# Patient Record
Sex: Female | Born: 1945 | Race: White | Hispanic: No | Smoking: Never smoker
Health system: Southern US, Community
[De-identification: ages and names within clinical notes are randomized; demographics above are authoritative.]

## PROBLEM LIST (undated history)

## (undated) DIAGNOSIS — I10 Essential (primary) hypertension: Secondary | ICD-10-CM

## (undated) DIAGNOSIS — E785 Hyperlipidemia, unspecified: Secondary | ICD-10-CM

## (undated) HISTORY — PX: BREAST LUMPECTOMY: SHX2

## (undated) HISTORY — PX: REPLACEMENT TOTAL KNEE: SUR1224

## (undated) HISTORY — PX: CHOLECYSTECTOMY: SHX55

---

## 2013-04-23 ENCOUNTER — Emergency Department (HOSPITAL_BASED_OUTPATIENT_CLINIC_OR_DEPARTMENT_OTHER): Payer: PRIVATE HEALTH INSURANCE

## 2013-04-23 ENCOUNTER — Encounter (HOSPITAL_BASED_OUTPATIENT_CLINIC_OR_DEPARTMENT_OTHER): Payer: Self-pay

## 2013-04-23 ENCOUNTER — Emergency Department (HOSPITAL_BASED_OUTPATIENT_CLINIC_OR_DEPARTMENT_OTHER)
Admission: EM | Admit: 2013-04-23 | Discharge: 2013-04-23 | Disposition: A | Discharge status: Home / Self Care | Payer: PRIVATE HEALTH INSURANCE | Attending: Emergency Medicine | Admitting: Emergency Medicine

## 2013-04-23 DIAGNOSIS — E785 Hyperlipidemia, unspecified: Secondary | ICD-10-CM | POA: Insufficient documentation

## 2013-04-23 DIAGNOSIS — Y9301 Activity, walking, marching and hiking: Secondary | ICD-10-CM | POA: Insufficient documentation

## 2013-04-23 DIAGNOSIS — S6990XA Unspecified injury of unspecified wrist, hand and finger(s), initial encounter: Secondary | ICD-10-CM | POA: Insufficient documentation

## 2013-04-23 DIAGNOSIS — S32592A Other specified fracture of left pubis, initial encounter for closed fracture: Secondary | ICD-10-CM

## 2013-04-23 DIAGNOSIS — Z880 Allergy status to penicillin: Secondary | ICD-10-CM | POA: Insufficient documentation

## 2013-04-23 DIAGNOSIS — Z79899 Other long term (current) drug therapy: Secondary | ICD-10-CM | POA: Insufficient documentation

## 2013-04-23 DIAGNOSIS — Y9289 Other specified places as the place of occurrence of the external cause: Secondary | ICD-10-CM | POA: Insufficient documentation

## 2013-04-23 DIAGNOSIS — S79919A Unspecified injury of unspecified hip, initial encounter: Secondary | ICD-10-CM | POA: Insufficient documentation

## 2013-04-23 DIAGNOSIS — W010XXA Fall on same level from slipping, tripping and stumbling without subsequent striking against object, initial encounter: Secondary | ICD-10-CM | POA: Insufficient documentation

## 2013-04-23 DIAGNOSIS — S32509A Unspecified fracture of unspecified pubis, initial encounter for closed fracture: Secondary | ICD-10-CM | POA: Insufficient documentation

## 2013-04-23 DIAGNOSIS — Z7982 Long term (current) use of aspirin: Secondary | ICD-10-CM | POA: Insufficient documentation

## 2013-04-23 DIAGNOSIS — I10 Essential (primary) hypertension: Secondary | ICD-10-CM | POA: Insufficient documentation

## 2013-04-23 DIAGNOSIS — IMO0002 Reserved for concepts with insufficient information to code with codable children: Secondary | ICD-10-CM | POA: Insufficient documentation

## 2013-04-23 HISTORY — DX: Essential (primary) hypertension: I10

## 2013-04-23 HISTORY — DX: Hyperlipidemia, unspecified: E78.5

## 2013-04-23 MED ORDER — OXYCODONE-ACETAMINOPHEN 5-325 MG PO TABS
1.0000 | ORAL_TABLET | Freq: Once | ORAL | Status: AC
  Administered 2013-04-23: 1 via ORAL
  Filled 2013-04-23 (×2): qty 1

## 2013-04-23 MED ORDER — OXYCODONE-ACETAMINOPHEN 5-325 MG PO TABS: 1.0000 | ORAL_TABLET | ORAL | Status: AC | PRN

## 2013-04-23 NOTE — ED Provider Notes (Signed)
History  This chart was scribed for Rolan Bucco, MD, by Candelaria Stagers, ED Scribe. This patient was seen in room MH12/MH12 and the patient's care was started at 4:30 PM   CSN: [id number]  Arrival date & time 04/23/13  1612   First MD Initiated Contact with Patient 04/23/13 1624      Chief Complaint  Patient presents with  . Fall     The history is provided by the patient. No language interpreter was used.   Tamara Deleon is a 67 y.o. female who presents to the Emergency Department complaining of left hip pain after she fell while walking landing on her left side last night around 9PM. She states her legs got tangled up which caused her to fall She reports catching herself with her hands and is also experiencing bilateral hand pain, left elbow pain, and an abrasion to the left elbow.  Pt states that bearing weight makes the hip pain worse.  She denies hitting her head or LOC.  She denies neck pain.  Pt has taken ibuprofen with no relief.   Past Medical History  Diagnosis Date  . Hypertension   . Hyperlipidemia     Past Surgical History  Procedure Laterality Date  . Breast lumpectomy      History reviewed. No pertinent family history.  History  Substance Use Topics  . Smoking status: Never Smoker   . Smokeless tobacco: Never Used  . Alcohol Use: No    OB History   Grav Para Term Preterm Abortions TAB SAB Ect Mult Living                  Review of Systems  Constitutional: Negative for fever, chills, diaphoresis and fatigue.  HENT: Negative for congestion, rhinorrhea and sneezing.   Eyes: Negative.   Respiratory: Negative for cough, chest tightness and shortness of breath.   Cardiovascular: Negative for chest pain and leg swelling.  Gastrointestinal: Negative for nausea, vomiting, abdominal pain, diarrhea and blood in stool.  Genitourinary: Negative for frequency, hematuria, flank pain and difficulty urinating.  Musculoskeletal: Positive for arthralgias and gait  problem. Negative for back pain.  Skin: Negative for rash and wound.  Neurological: Negative for dizziness, speech difficulty, weakness, numbness and headaches.  All other systems reviewed and are negative.    Allergies  Penicillins  Home Medications   Current Outpatient Rx  Name  Route  Sig  Dispense  Refill  . aspirin 81 MG tablet   Oral   Take 81 mg by mouth daily.         . hydrochlorothiazide (MICROZIDE) 12.5 MG capsule   Oral   Take 12.5 mg by mouth daily.         Marland Kitchen lisinopril (PRINIVIL,ZESTRIL) 10 MG tablet   Oral   Take 10 mg by mouth daily.         . multivitamin-iron-minerals-folic acid (CENTRUM) chewable tablet   Oral   Chew 1 tablet by mouth daily.         . simvastatin (ZOCOR) 40 MG tablet   Oral   Take 40 mg by mouth at bedtime.         Marland Kitchen oxyCODONE-acetaminophen (PERCOCET) 5-325 MG per tablet   Oral   Take 1 tablet by mouth every 4 (four) hours as needed for pain.   20 tablet   0     BP 115/65  Pulse 79  Temp(Src) 98.1 F (36.7 C) (Oral)  Resp 20  Ht 5\' 7"  (1.702 m)  Wt 156 lb (70.761 kg)  BMI 24.43 kg/m2  SpO2 96%  Physical Exam  Constitutional: She is oriented to person, place, and time. She appears well-developed and well-nourished.  HENT:  Head: Normocephalic and atraumatic.  Eyes: Pupils are equal, round, and reactive to light.  Neck: Normal range of motion. Neck supple.  Cardiovascular: Normal rate, regular rhythm and normal heart sounds.   Pulmonary/Chest: Effort normal and breath sounds normal. No respiratory distress. She has no wheezes. She has no rales. She exhibits no tenderness.  Abdominal: Soft. Bowel sounds are normal. There is no tenderness. There is no rebound and no guarding.  Musculoskeletal: Normal range of motion. She exhibits no edema.  Patient has no tenderness along the spine. She has some mild pain on range of motion of the left hip. She has pain on palpation of the pubic symphysis. She has no pain on range  of motion of the right hip. She has small amount of ecchymosis over his left elbow and the left knee but no underlying bony tenderness. No other pain on palpation or range of motion of extremities  Lymphadenopathy:    She has no cervical adenopathy.  Neurological: She is alert and oriented to person, place, and time.  Skin: Skin is warm and dry. No rash noted.  Psychiatric: She has a normal mood and affect.    ED Course  Procedures   DIAGNOSTIC STUDIES: Oxygen Saturation is 96% on room air, normal by my interpretation.    COORDINATION OF CARE:  4:33 PM Discussed course of care with pt which includes images of left hip and pelvis.  Pt understands and agrees.   5:16 PM Discussed images with pt.   Labs Reviewed - No data to display Dg Hip Complete Left  04/23/2013  *RADIOLOGY REPORT*  Clinical Data: Fall, left hip pain  LEFT HIP - COMPLETE 2+ VIEW  Comparison: None.  Findings: Three views of the left hip submitted.  No acute fracture or subluxation.  Mild bilateral superior acetabular spurring.  IMPRESSION: No acute fracture or subluxation.   Original Report Authenticated By: Natasha Mead, M.D.    Ct Hip Left Wo Contrast  04/23/2013  *RADIOLOGY REPORT*  Clinical Data: Left hip pain with limited weightbearing since falling last night.  CT OF THE LEFT HIP WITHOUT CONTRAST  Technique:  Multidetector CT imaging was performed according to the standard protocol. Multiplanar CT image reconstructions were also generated.  Comparison: Radiographs same date.  Findings: Examination is limited to the left hip and lower left hemi pelvis.  The entire pelvis is not imaged.  There is a mildly displaced fracture of the left superior pubic ramus with intra-articular extension into the anterior acetabulum. The posterior acetabulum is intact.  There is no evidence of dislocation or proximal femoral fracture.  There is no evidence of femoral head avascular necrosis.  The left inferior pubic ramus is intact.  Reformatted  images partially include the right hip.  There are mild degenerative changes of both hips, slightly worse on the right. The sacroiliac joints are not imaged.  Moderate degenerative changes are present at the symphysis pubis.  There is no significant associated hematoma.  A small hip joint effusion is noted.  Diffuse diverticular changes of the sigmoid colon are present.  IMPRESSION:  1.  Mildly displaced fracture of the left superior pubic ramus with intra-articular extension into the left anterior acetabulum. 2.  No evidence of dislocation or proximal femur fracture. 3.  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   Original Report Authenticated By: Carey Bullocks, M.D.      1. Pubic ramus fracture, left, closed, initial encounter       MDM  I discussed the patient's x-ray with the orthopedist on call, Dr. Sherlean Foot, who advised to have pt f/u in his office next week to reevaluate the x-rays to ensure that the fracture is not extending further into the acetabulum. I discussed this with the patient and the patient's daughter. I gave her prescription for a walker to use at home as well as a prescription for pain medication.  I personally performed the services described in this documentation, which was scribed in my presence.  The recorded information has been reviewed and considered.         Rolan Bucco, MD 04/23/13 2350

## 2013-04-23 NOTE — ED Provider Notes (Signed)
History     CSN: [id number]  Arrival date & time 04/23/13  1612   First MD Initiated Contact with Patient 04/23/13 1624      Chief Complaint  Patient presents with  . Fall    (Consider location/radiation/quality/duration/timing/severity/associated sxs/prior treatment) HPI  Past Medical History  Diagnosis Date  . Hypertension   . Hyperlipidemia     Past Surgical History  Procedure Laterality Date  . Breast lumpectomy      History reviewed. No pertinent family history.  History  Substance Use Topics  . Smoking status: Never Smoker   . Smokeless tobacco: Never Used  . Alcohol Use: No    OB History   Grav Para Term Preterm Abortions TAB SAB Ect Mult Living                  Review of Systems  Allergies  Penicillins  Home Medications   Current Outpatient Rx  Name  Route  Sig  Dispense  Refill  . aspirin 81 MG tablet   Oral   Take 81 mg by mouth daily.         . hydrochlorothiazide (MICROZIDE) 12.5 MG capsule   Oral   Take 12.5 mg by mouth daily.         Marland Kitchen lisinopril (PRINIVIL,ZESTRIL) 10 MG tablet   Oral   Take 10 mg by mouth daily.         . multivitamin-iron-minerals-folic acid (CENTRUM) chewable tablet   Oral   Chew 1 tablet by mouth daily.         . simvastatin (ZOCOR) 40 MG tablet   Oral   Take 40 mg by mouth at bedtime.         Marland Kitchen oxyCODONE-acetaminophen (PERCOCET) 5-325 MG per tablet   Oral   Take 1 tablet by mouth every 4 (four) hours as needed for pain.   20 tablet   0     BP 119/52  Pulse 71  Temp(Src) 98.1 F (36.7 C) (Oral)  Resp 20  Ht 5\' 7"  (1.702 m)  Wt 156 lb (70.761 kg)  BMI 24.43 kg/m2  SpO2 96%  Physical Exam  ED Course  Procedures (including critical care time)  Labs Reviewed - No data to display Dg Hip Complete Left  04/23/2013  *RADIOLOGY REPORT*  Clinical Data: Fall, left hip pain  LEFT HIP - COMPLETE 2+ VIEW  Comparison: None.  Findings: Three views of the left hip submitted.  No acute fracture  or subluxation.  Mild bilateral superior acetabular spurring.  IMPRESSION: No acute fracture or subluxation.   Original Report Authenticated By: Natasha Mead, M.D.    Ct Hip Left Wo Contrast  04/23/2013  *RADIOLOGY REPORT*  Clinical Data: Left hip pain with limited weightbearing since falling last night.  CT OF THE LEFT HIP WITHOUT CONTRAST  Technique:  Multidetector CT imaging was performed according to the standard protocol. Multiplanar CT image reconstructions were also generated.  Comparison: Radiographs same date.  Findings: Examination is limited to the left hip and lower left hemi pelvis.  The entire pelvis is not imaged.  There is a mildly displaced fracture of the left superior pubic ramus with intra-articular extension into the anterior acetabulum. The posterior acetabulum is intact.  There is no evidence of dislocation or proximal femoral fracture.  There is no evidence of femoral head avascular necrosis.  The left inferior pubic ramus is intact.  Reformatted images partially include the right hip.  There are mild degenerative changes of both  hips, slightly worse on the right. The sacroiliac joints are not imaged.  Moderate degenerative changes are present at the symphysis pubis.  There is no significant associated hematoma.  A small hip joint effusion is noted.  Diffuse diverticular changes of the sigmoid colon are present.  IMPRESSION:  1.  Mildly displaced fracture of the left superior pubic ramus with intra-articular extension into the left anterior acetabulum. 2.  No evidence of dislocation or proximal femur fracture. 3.  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   Original Report Authenticated By: Carey Bullocks, M.D.      1. Pubic ramus fracture, left, closed, initial encounter       MDM  I discussed the x-ray findings with Dr. Valentina Gu with orthopedics who recommends that the patient followup in his office next week for early x-ray to make sure that the fracture is not  extending further into the acetabulum. I discussed this with the patient and her daughter. I will prescribe her a walker to use at home. I also prescribed her Percocet for pain. Advised to return as needed for any worsening symptoms.       Rolan Bucco, MD 04/23/13 1840

## 2013-04-23 NOTE — ED Notes (Signed)
Pt states that she fell just prior to arrival, states that she has severe pain on entire L side of her body, denies numbness tingling, PMS intact.  C/o severe L hip pain.

## 2013-09-05 IMAGING — CT CT HIP*L* W/O CM
2 of 3 series · 17 of 46 positions shown, 19 images · non-contrast
Comparison: Radiographs same date.

CLINICAL DATA: Left hip pain with limited weightbearing since
falling last night.

CT OF THE LEFT HIP WITHOUT CONTRAST
TECHNIQUE: Multidetector CT imaging was performed according to the
standard protocol. Multiplanar CT image reconstructions were also
generated.

[Series 3: hip 2.0 b31f st · axial · 0.34mm/px · z∈[-260,-118]mm · 14 of 83 slices shown, 16 images]
[im 6/83  soft-tissue]
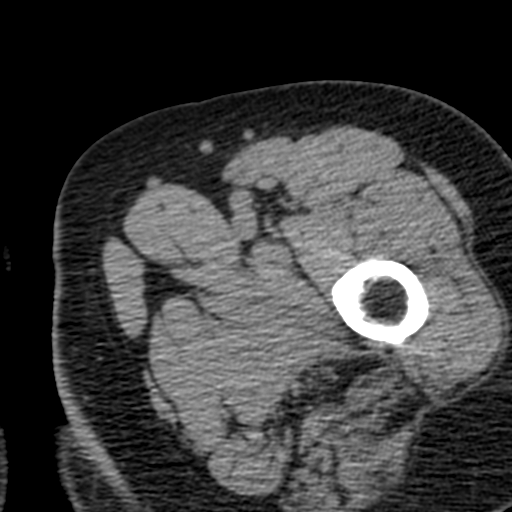
[im 6/83  bone]
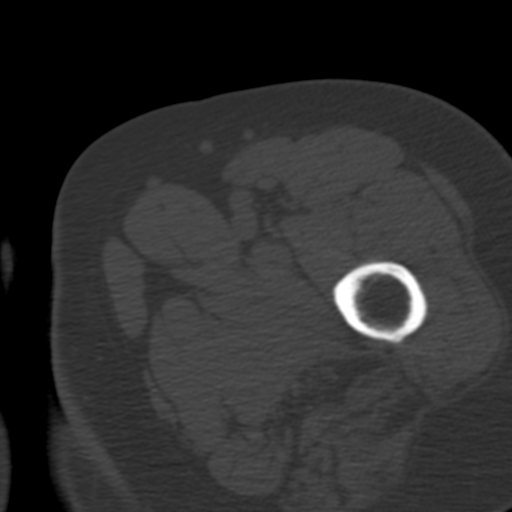
[im 11/83  soft-tissue]
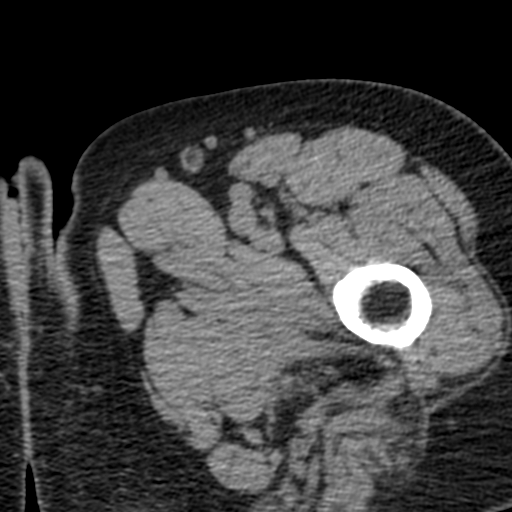
[im 16/83  soft-tissue]
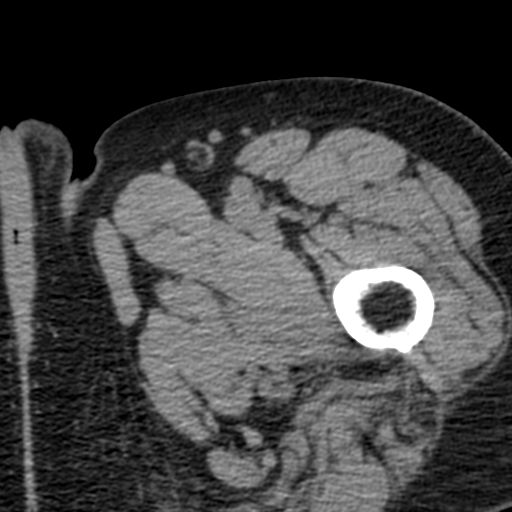
[im 22/83  soft-tissue]
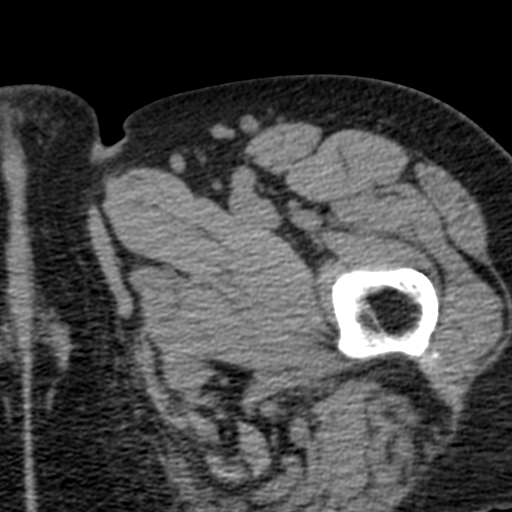
[im 27/83  soft-tissue]
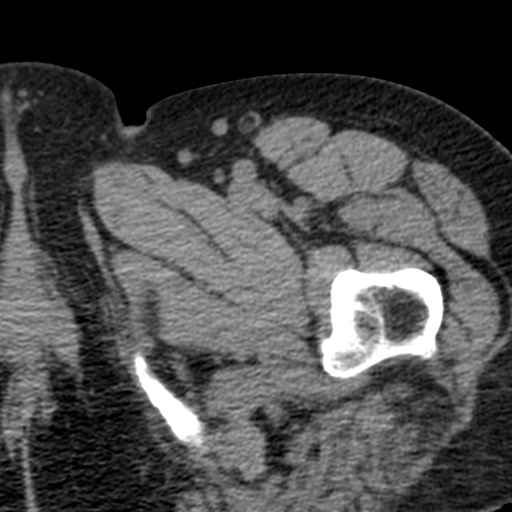
[im 32/83  soft-tissue]
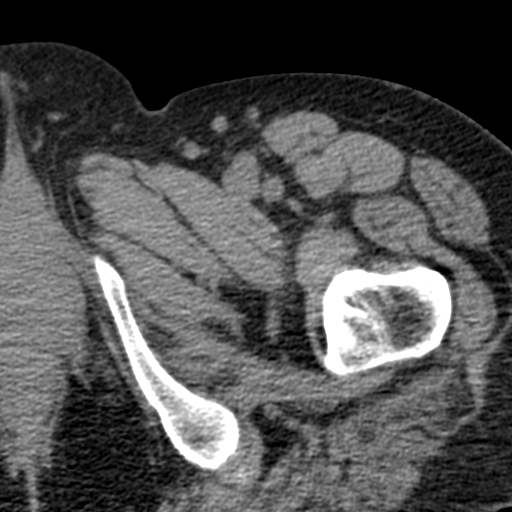
[im 38/83  soft-tissue]
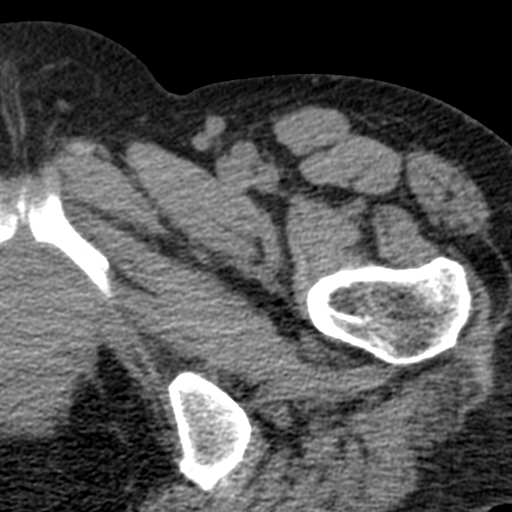
[im 45/83  soft-tissue]
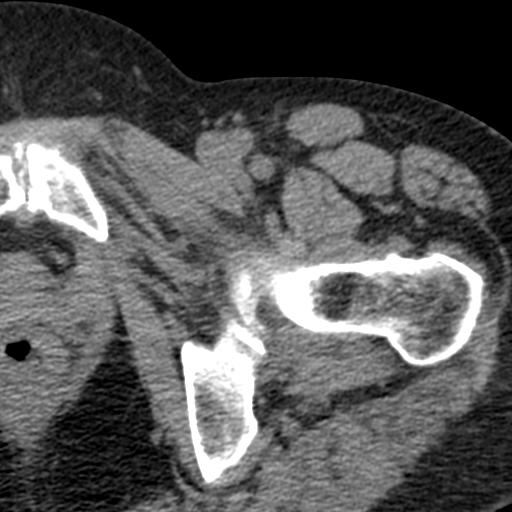
[im 51/83  soft-tissue]
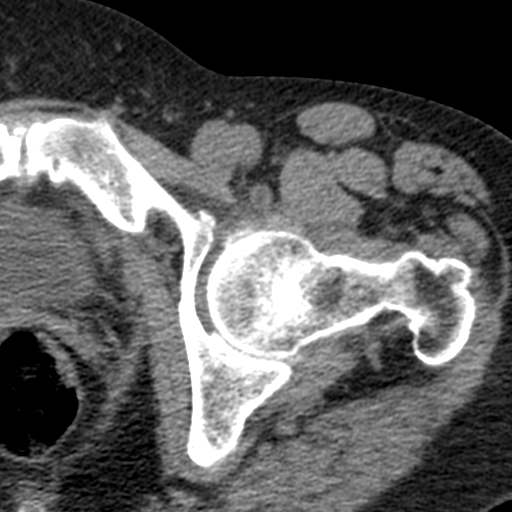
[im 51/83  bone]
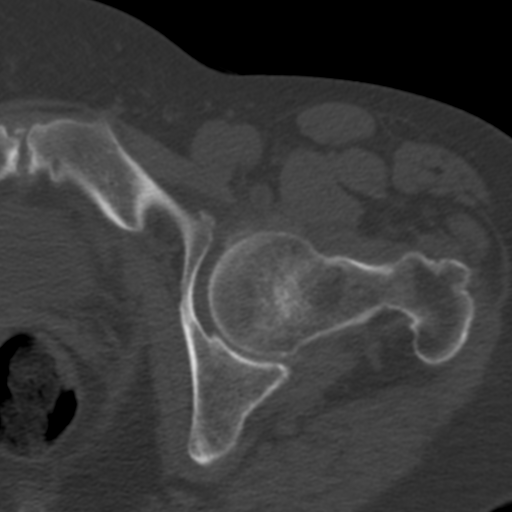
[im 56/83  soft-tissue]
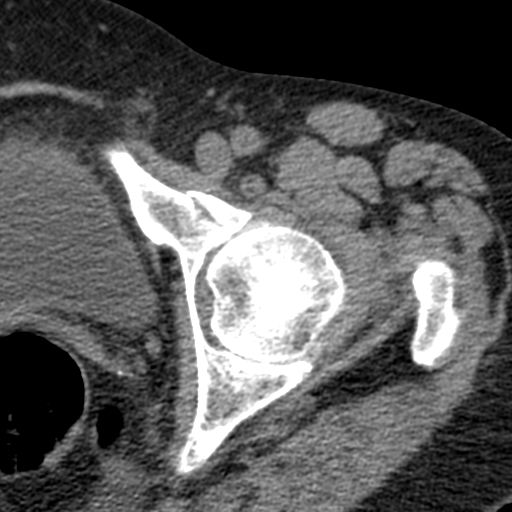
[im 61/83  soft-tissue]
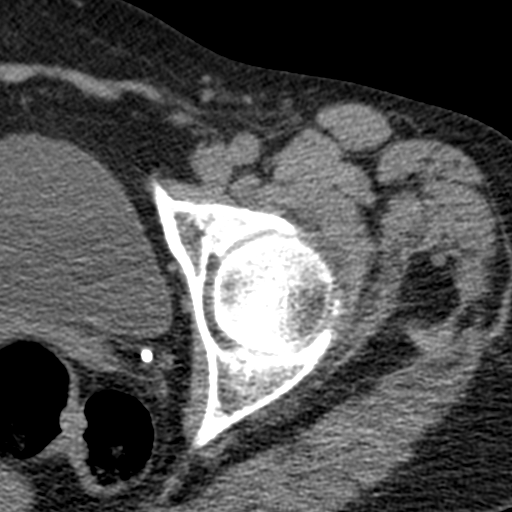
[im 67/83  soft-tissue]
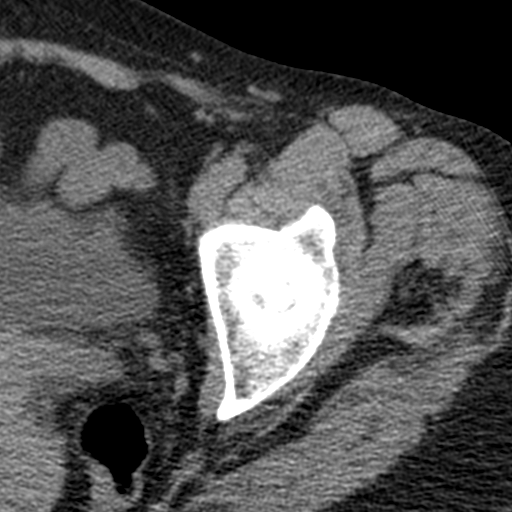
[im 72/83  soft-tissue]
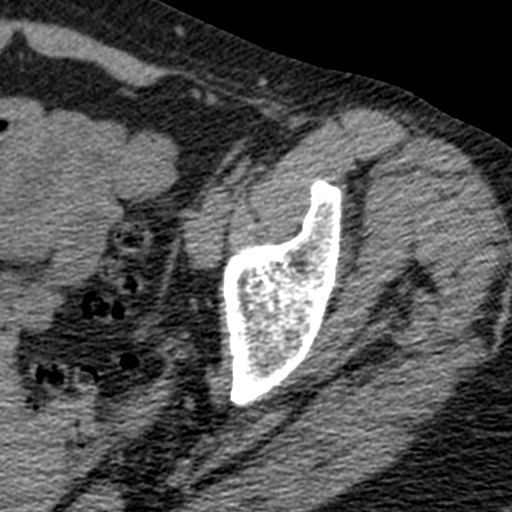
[im 77/83  soft-tissue]
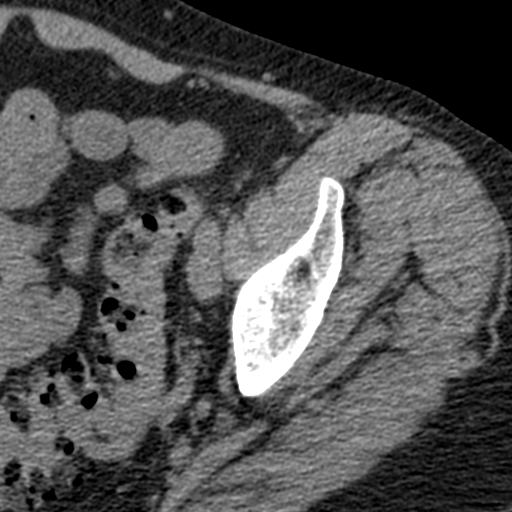

[Series 4: hip 2.0 coronal · coronal · 0.74mm/px · 3 of 53 slices shown]
[im 18/53  soft-tissue]
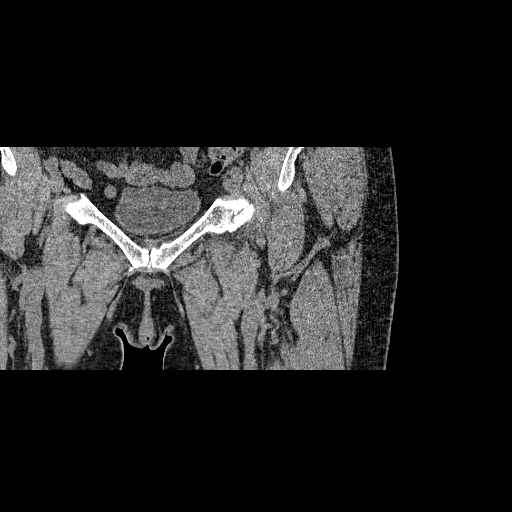
[im 24/53  soft-tissue]
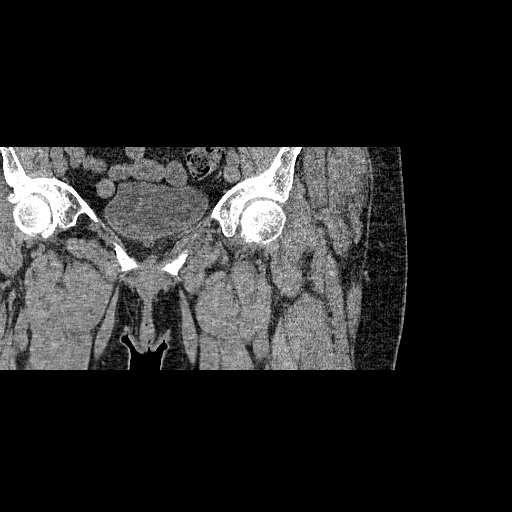
[im 29/53  soft-tissue]
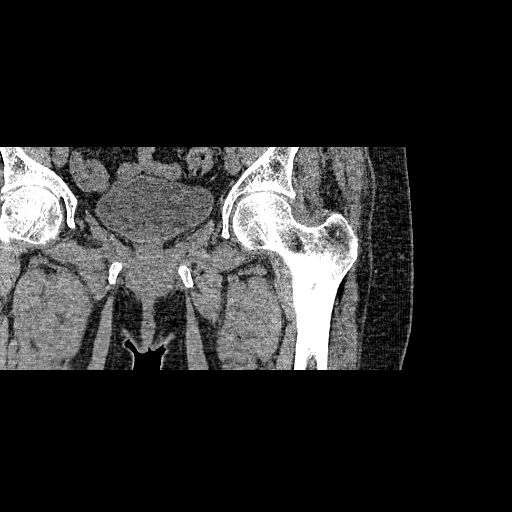

[17 of 46 positions shown; findings below may reference images not displayed]

FINDINGS: Examination is limited to the left hip and lower left
hemi pelvis.  The entire pelvis is not imaged.

There is a mildly displaced fracture of the left superior pubic
ramus with intra-articular extension into the anterior acetabulum.
The posterior acetabulum is intact.  There is no evidence of
dislocation or proximal femoral fracture.  There is no evidence of
femoral head avascular necrosis.  The left inferior pubic ramus is
intact.

Reformatted images partially include the right hip.  There are mild
degenerative changes of both hips, slightly worse on the right.
The sacroiliac joints are not imaged.  Moderate degenerative
changes are present at the symphysis pubis.

There is no significant associated hematoma.  A small hip joint
effusion is noted.  Diffuse diverticular changes of the sigmoid
colon are present.
IMPRESSION: 1.  Mildly displaced fracture of the left superior pubic ramus with
intra-articular extension into the left anterior acetabulum.
2.  No evidence of dislocation or proximal femur fracture.
3.  Degenerative changes of both hips and the symphysis pubis.

## 2013-09-05 IMAGING — CR DG HIP (WITH OR WITHOUT PELVIS) 2-3V*L*
3 series · 3 of 3 positions shown · non-contrast
Comparison: None.

CLINICAL DATA: Fall, left hip pain

LEFT HIP - COMPLETE 2+ VIEW

[t pelvis a.p.]
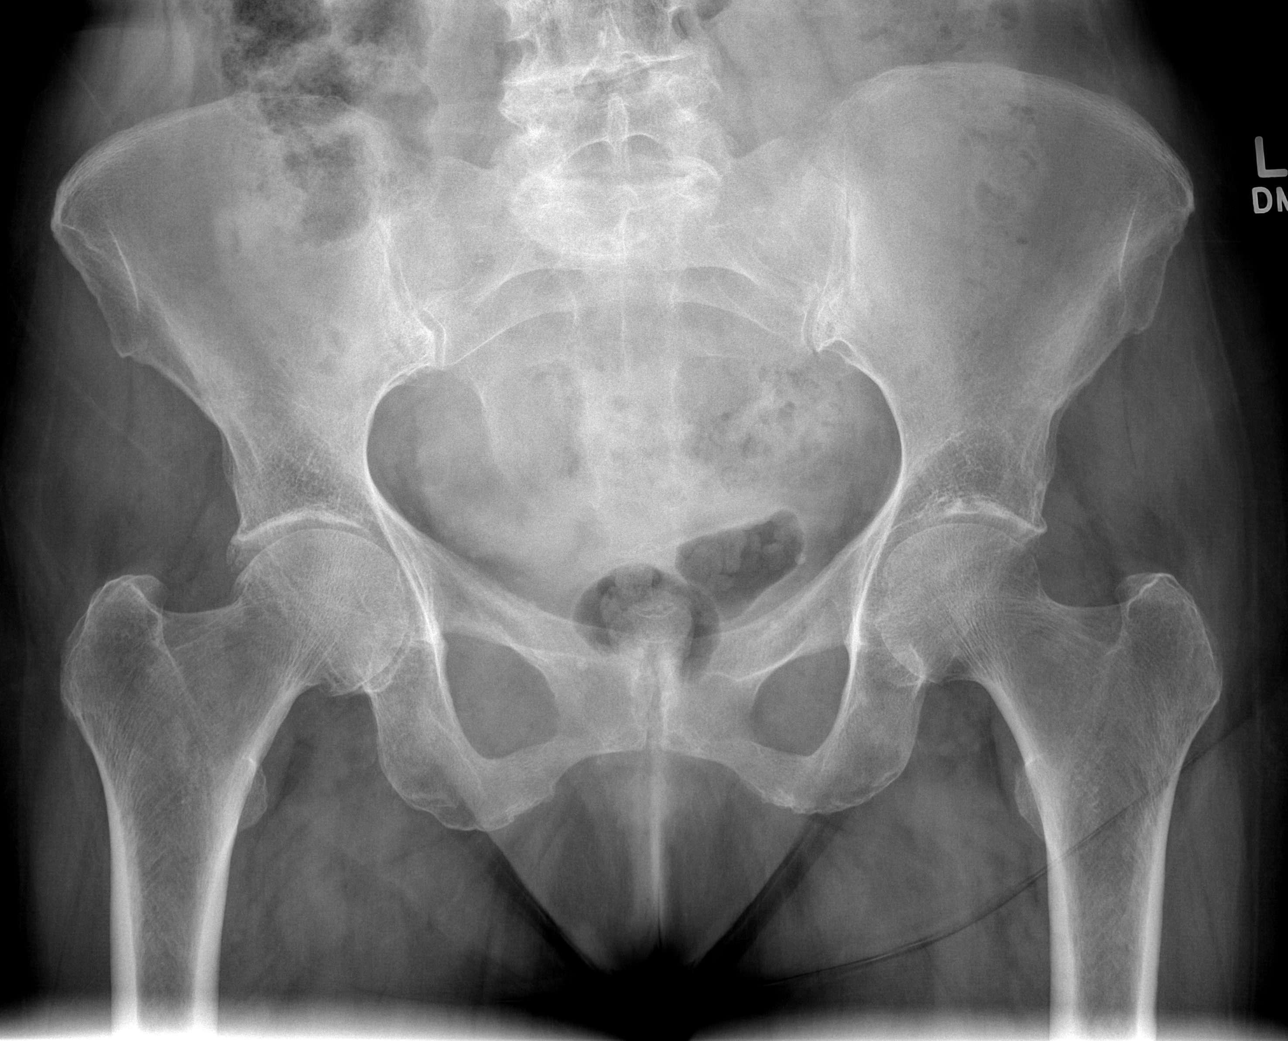

[t hip ap left]
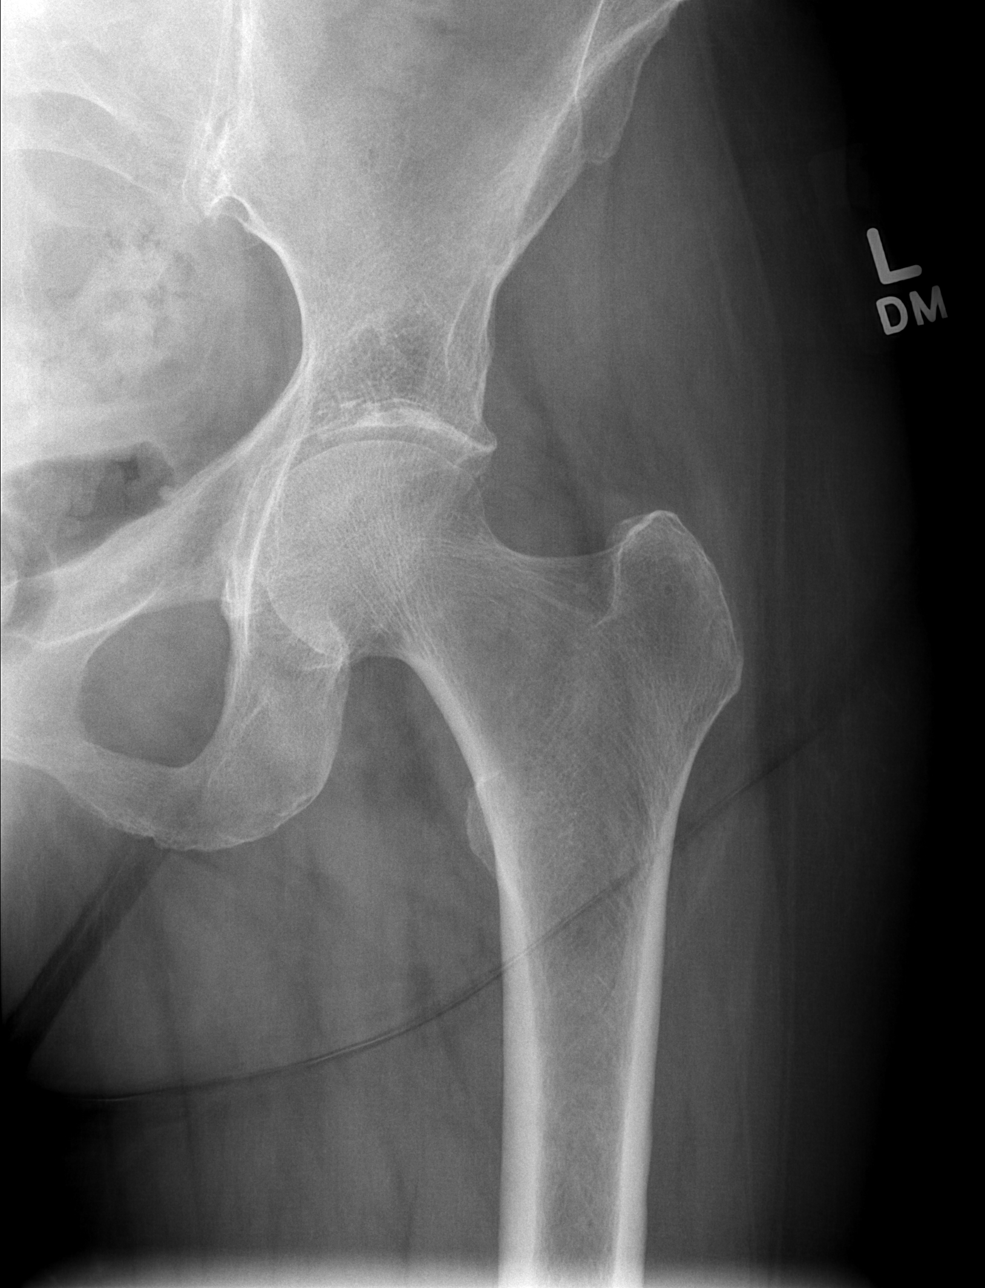

[t hip frog leg left]
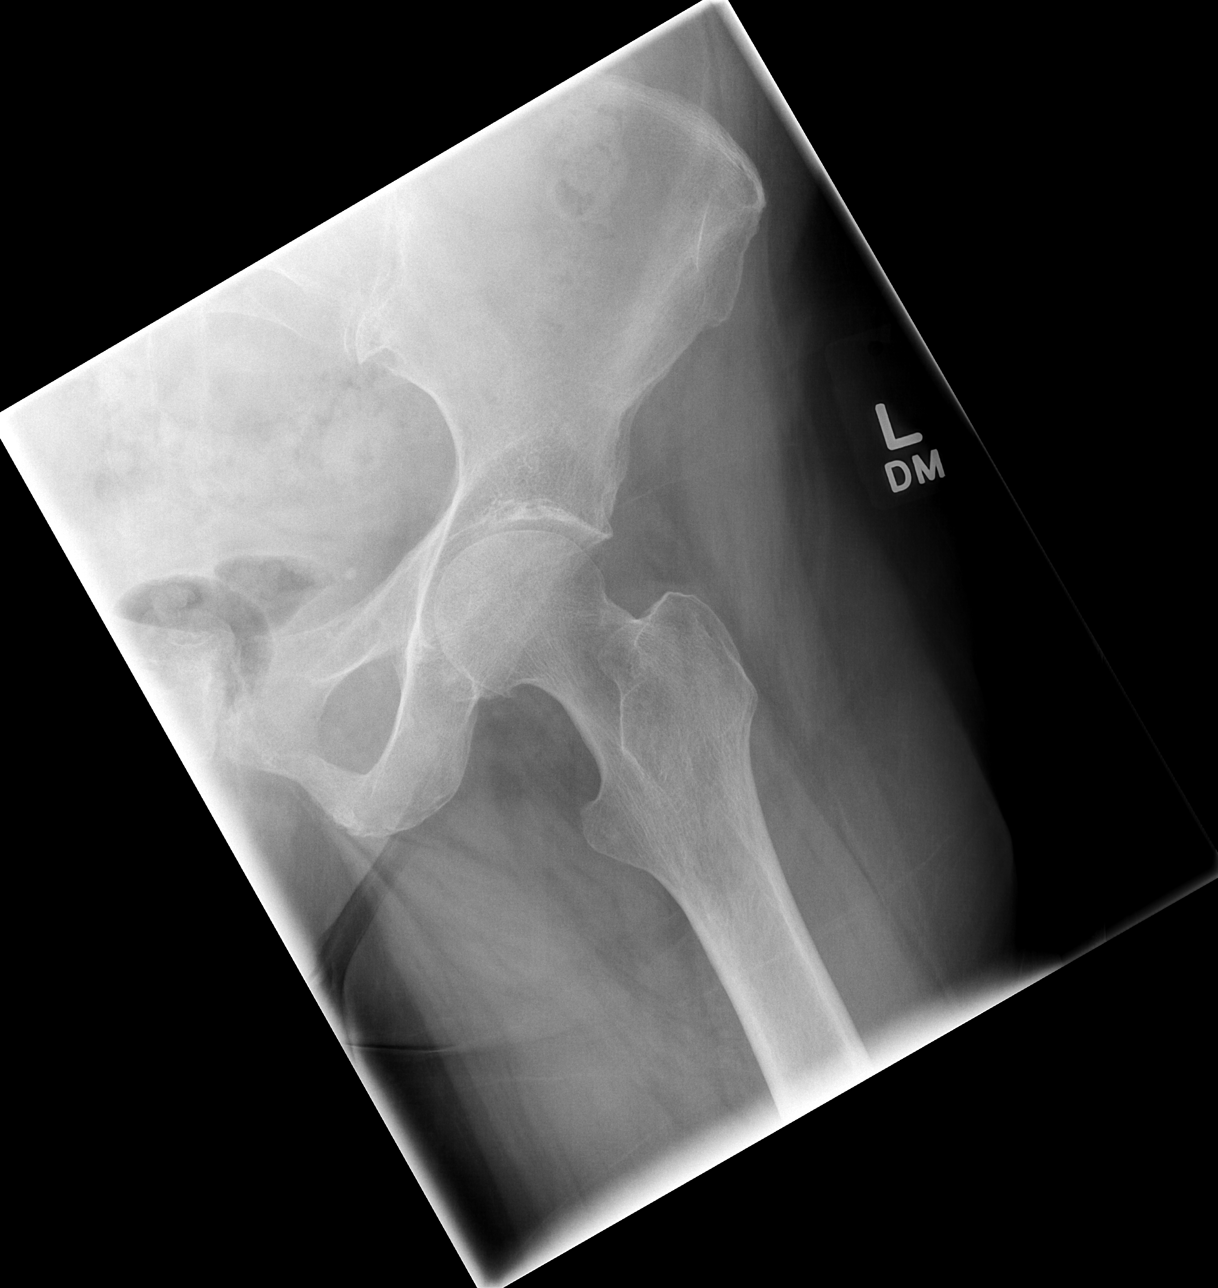

[3 of 3 positions shown; findings below may reference images not displayed]

FINDINGS: Three views of the left hip submitted.  No acute fracture
or subluxation.  Mild bilateral superior acetabular spurring.
IMPRESSION: No acute fracture or subluxation.

## 2017-08-19 ENCOUNTER — Emergency Department (HOSPITAL_BASED_OUTPATIENT_CLINIC_OR_DEPARTMENT_OTHER)
Admission: EM | Admit: 2017-08-19 | Discharge: 2017-08-19 | Disposition: A | Discharge status: Home / Self Care | Payer: Medicare Other | Attending: Emergency Medicine | Admitting: Emergency Medicine

## 2017-08-19 ENCOUNTER — Encounter (HOSPITAL_BASED_OUTPATIENT_CLINIC_OR_DEPARTMENT_OTHER): Payer: Self-pay | Admitting: Internal Medicine

## 2017-08-19 DIAGNOSIS — Y9301 Activity, walking, marching and hiking: Secondary | ICD-10-CM | POA: Insufficient documentation

## 2017-08-19 DIAGNOSIS — Z7982 Long term (current) use of aspirin: Secondary | ICD-10-CM | POA: Diagnosis not present

## 2017-08-19 DIAGNOSIS — I10 Essential (primary) hypertension: Secondary | ICD-10-CM | POA: Diagnosis not present

## 2017-08-19 DIAGNOSIS — W0110XA Fall on same level from slipping, tripping and stumbling with subsequent striking against unspecified object, initial encounter: Secondary | ICD-10-CM | POA: Insufficient documentation

## 2017-08-19 DIAGNOSIS — Z96652 Presence of left artificial knee joint: Secondary | ICD-10-CM | POA: Diagnosis not present

## 2017-08-19 DIAGNOSIS — Z79899 Other long term (current) drug therapy: Secondary | ICD-10-CM | POA: Diagnosis not present

## 2017-08-19 DIAGNOSIS — Y929 Unspecified place or not applicable: Secondary | ICD-10-CM | POA: Diagnosis not present

## 2017-08-19 DIAGNOSIS — W19XXXA Unspecified fall, initial encounter: Secondary | ICD-10-CM

## 2017-08-19 DIAGNOSIS — S0990XA Unspecified injury of head, initial encounter: Secondary | ICD-10-CM | POA: Diagnosis present

## 2017-08-19 DIAGNOSIS — S0083XA Contusion of other part of head, initial encounter: Secondary | ICD-10-CM | POA: Diagnosis not present

## 2017-08-19 DIAGNOSIS — Y999 Unspecified external cause status: Secondary | ICD-10-CM | POA: Diagnosis not present

## 2017-08-19 NOTE — ED Notes (Signed)
Pt verbalizes understanding of d/c instructions and denies any further need at this time. 

## 2017-08-19 NOTE — ED Provider Notes (Signed)
[facility]   CSN: [id number] Arrival date & time: 08/19/17  1810     History   Chief Complaint Chief Complaint  Patient presents with  . Fall    HPI Tamara Deleon is a 71 y.o. female.  71 yo F With a chief complaint of headache injury. Patient was sleep walking and walked into a wall. This happened about 4 days ago. Saw her family physician and they suggested she get a outpatient imaging study. Her daughter became concerned because it had not yet been done. The patient has gotten significant better and currently does not have any symptoms. She denies double vision she denies weakness. She is on a baby aspirin but stopped taking the couple days ago   The history is provided by the patient.  Fall  This is a new problem. The current episode started more than 2 days ago. The problem occurs rarely. The problem has not changed since onset.Associated symptoms include headaches. Pertinent negatives include no chest pain and no shortness of breath. Nothing aggravates the symptoms. Nothing relieves the symptoms. She has tried nothing for the symptoms. The treatment provided no relief.    Past Medical History:  Diagnosis Date  . Hyperlipidemia   . Hypertension     There are no active problems to display for this patient.   Past Surgical History:  Procedure Laterality Date  . BREAST LUMPECTOMY    . CHOLECYSTECTOMY    . REPLACEMENT TOTAL KNEE Left     OB History    No data available       Home Medications    Prior to Admission medications   Medication Sig Start Date End Date Taking? Authorizing Provider  aspirin 81 MG tablet Take 81 mg by mouth daily.    [provider]  hydrochlorothiazide (MICROZIDE) 12.5 MG capsule Take 12.5 mg by mouth daily.    [provider]  lisinopril (PRINIVIL,ZESTRIL) 10 MG tablet Take 10 mg by mouth daily.    [provider]  multivitamin-iron-minerals-folic acid (CENTRUM) chewable tablet  Chew 1 tablet by mouth daily.    [provider]  oxyCODONE-acetaminophen (PERCOCET) 5-325 MG per tablet Take 1 tablet by mouth every 4 (four) hours as needed for pain. 04/23/13   Rolan Bucco, MD  simvastatin (ZOCOR) 40 MG tablet Take 40 mg by mouth at bedtime.    [provider]    Family History History reviewed. No pertinent family history.  Social History Social History  Substance Use Topics  . Smoking status: Never Smoker  . Smokeless tobacco: Never Used  . Alcohol use No     Allergies   Penicillins   Review of Systems Review of Systems  Constitutional: Negative for chills and fever.  HENT: Positive for facial swelling. Negative for congestion and rhinorrhea.   Eyes: Negative for redness and visual disturbance.  Respiratory: Negative for shortness of breath and wheezing.   Cardiovascular: Negative for chest pain and palpitations.  Gastrointestinal: Negative for nausea and vomiting.  Genitourinary: Negative for dysuria and urgency.  Musculoskeletal: Negative for arthralgias and myalgias.  Skin: Negative for pallor and wound.  Neurological: Positive for headaches. Negative for dizziness.     Physical Exam Updated Vital Signs BP (!) 173/93   Pulse 80   Temp 98.3 F (36.8 C) (Oral)   Resp 18   Ht 5' 7.5" (1.715 m)   Wt 69.9 kg (154 lb)   SpO2 98%   BMI 23.76 kg/m   Physical Exam  Constitutional: She is oriented to person, place, and time. She appears well-developed and well-nourished. No distress.  HENT:  Head: Normocephalic.    Eyes: Pupils are equal, round, and reactive to light. EOM are normal.  Neck: Normal range of motion. Neck supple.  Cardiovascular: Normal rate and regular rhythm.  Exam reveals no gallop and no friction rub.   No murmur heard. Pulmonary/Chest: Effort normal. She has no wheezes. She has no rales.  Abdominal: Soft. She exhibits no distension. There is no tenderness.  Musculoskeletal: She exhibits no edema or  tenderness.  Neurological: She is alert and oriented to person, place, and time. She has normal strength. No cranial nerve deficit or sensory deficit. Coordination and gait normal. GCS eye subscore is 4. GCS verbal subscore is 5. GCS motor subscore is 6.  Skin: Skin is warm and dry. She is not diaphoretic.  Psychiatric: She has a normal mood and affect. Her behavior is normal.  Nursing note and vitals reviewed.    ED Treatments / Results  Labs (all labs ordered are listed, but only abnormal results are displayed) Labs Reviewed - No data to display  EKG  EKG Interpretation None       Radiology No results found.  Procedures Procedures (including critical care time)  Medications Ordered in ED Medications - No data to display   Initial Impression / Assessment and Plan / ED Course  I have reviewed the triage vital signs and the nursing notes.  Pertinent labs & imaging results that were available during my care of the patient were reviewed by me and considered in my medical decision making (see chart for details).     71 yo F With a chief complaint of headache injury. Patient was sleep walking and walked into a wall. This happened about 4 days ago. Saw her family physician and they suggested she get a outpatient imaging study. Her daughter became concerned because it had not yet been done. The patient has gotten significant better and currently does not have any symptoms. She denies double vision she denies weakness. She is on a baby aspirin but stopped taking the couple days ago. Neuro exam is benign. I discussed risks and benefits of CT. At this point the family is electing to not have it done. They'll return for any worsening symptoms.  8:10 PM:  I have discussed the diagnosis/risks/treatment options with the patient and family and believe the pt to be eligible for discharge home to follow-up with PCP. We also discussed returning to the ED immediately if new or worsening sx occur.  We discussed the sx which are most concerning (e.g., sudden worsening pain, fever, inability to tolerate by mouth) that necessitate immediate return. Medications administered to the patient during their visit and any new prescriptions provided to the patient are listed below.  Medications given during this visit Medications - No data to display   The patient appears reasonably screen and/or stabilized for discharge and I doubt any other medical condition or other [facility] requiring further screening, evaluation, or treatment in the ED at this time prior to discharge.    Final Clinical Impressions(s) / ED Diagnoses   Final diagnoses:  Fall, initial encounter  Contusion of face, initial encounter    New Prescriptions New Prescriptions   No medications on file     Melene PlanFloyd, Riann Oman, DO 08/19/17 2010

## 2017-08-19 NOTE — ED Triage Notes (Addendum)
Pt reports falling at 2am on Sunday 08/16/17. Pt denies dizziness and states that she was sleep walking when she fell. Pt was dreaming when the fall happened and woke up when she fell. Pt remembers everything that happened after waking up on the floor. Pt is not on blood thinners.
